# Patient Record
Sex: Male | Born: 1980 | Race: White | Hispanic: No | Marital: Married | Smoking: Former smoker
Health system: Southern US, Community
[De-identification: ages and names within clinical notes are randomized; demographics above are authoritative.]

## PROBLEM LIST (undated history)

## (undated) DIAGNOSIS — K219 Gastro-esophageal reflux disease without esophagitis: Secondary | ICD-10-CM

## (undated) DIAGNOSIS — N200 Calculus of kidney: Secondary | ICD-10-CM

---

## 1999-05-11 ENCOUNTER — Emergency Department (HOSPITAL_COMMUNITY): Admission: EM | Admit: 1999-05-11 | Discharge: 1999-05-11 | Payer: Self-pay | Admitting: Emergency Medicine

## 2005-09-11 ENCOUNTER — Emergency Department (HOSPITAL_COMMUNITY): Admission: EM | Admit: 2005-09-11 | Discharge: 2005-09-11 | Payer: Self-pay | Admitting: Emergency Medicine

## 2006-01-19 ENCOUNTER — Emergency Department (HOSPITAL_COMMUNITY): Admission: EM | Admit: 2006-01-19 | Discharge: 2006-01-19 | Payer: Self-pay | Admitting: Emergency Medicine

## 2006-01-20 ENCOUNTER — Emergency Department (HOSPITAL_COMMUNITY): Admission: EM | Admit: 2006-01-20 | Discharge: 2006-01-20 | Payer: Self-pay | Admitting: *Deleted

## 2006-01-25 ENCOUNTER — Emergency Department (HOSPITAL_COMMUNITY): Admission: EM | Admit: 2006-01-25 | Discharge: 2006-01-25 | Payer: Self-pay | Admitting: Emergency Medicine

## 2006-07-08 ENCOUNTER — Emergency Department (HOSPITAL_COMMUNITY): Admission: EM | Admit: 2006-07-08 | Discharge: 2006-07-08 | Payer: Self-pay | Admitting: Emergency Medicine

## 2007-03-31 ENCOUNTER — Emergency Department (HOSPITAL_COMMUNITY): Admission: EM | Admit: 2007-03-31 | Discharge: 2007-03-31 | Payer: Self-pay | Admitting: Emergency Medicine

## 2011-06-02 LAB — URINALYSIS, ROUTINE W REFLEX MICROSCOPIC
Glucose, UA: NEGATIVE
Leukocytes, UA: NEGATIVE
Nitrite: NEGATIVE

## 2011-06-02 LAB — URINE MICROSCOPIC-ADD ON

## 2013-04-06 ENCOUNTER — Emergency Department (HOSPITAL_COMMUNITY): Payer: Self-pay

## 2013-04-06 ENCOUNTER — Emergency Department (HOSPITAL_COMMUNITY)
Admission: EM | Admit: 2013-04-06 | Discharge: 2013-04-06 | Disposition: A | Discharge status: Home / Self Care | Payer: Self-pay | Attending: Emergency Medicine | Admitting: Emergency Medicine

## 2013-04-06 ENCOUNTER — Encounter (HOSPITAL_COMMUNITY): Payer: Self-pay | Admitting: Emergency Medicine

## 2013-04-06 DIAGNOSIS — R231 Pallor: Secondary | ICD-10-CM | POA: Insufficient documentation

## 2013-04-06 DIAGNOSIS — K5900 Constipation, unspecified: Secondary | ICD-10-CM | POA: Insufficient documentation

## 2013-04-06 DIAGNOSIS — Z87442 Personal history of urinary calculi: Secondary | ICD-10-CM | POA: Insufficient documentation

## 2013-04-06 DIAGNOSIS — F17200 Nicotine dependence, unspecified, uncomplicated: Secondary | ICD-10-CM | POA: Insufficient documentation

## 2013-04-06 HISTORY — DX: Calculus of kidney: N20.0

## 2013-04-06 MED ORDER — MAGNESIUM CITRATE PO SOLN
1.0000 | Freq: Once | ORAL | Status: AC
  Administered 2013-04-06: 1 via ORAL
  Filled 2013-04-06: qty 296

## 2013-04-06 MED ORDER — MINERAL OIL RE ENEM
1.0000 | ENEMA | Freq: Once | RECTAL | Status: AC
  Administered 2013-04-06: 1 via RECTAL
  Filled 2013-04-06: qty 1

## 2013-04-06 NOTE — ED Provider Notes (Signed)
CSN: [id number]     Arrival date & time 04/06/13  7829 History     First MD Initiated Contact with Patient 04/06/13 0248     Chief Complaint  Patient presents with  . Abdominal Pain   (Consider location/radiation/quality/duration/timing/severity/associated sxs/prior Treatment) HPI Comments: Vision states, that he has a small bowel movement.  On Monday, but still feels, like he has to have an additional bowel movement.  He has tried taking the Floxin is at or a Gas-X and Pepto-Bismol without relief.  He denies any previous history of constipation.  He has had no abdominal surgeries.  She does not take medication on a regular basis.  He does not have any ill contacts  Patient is a 32 y.o. male presenting with abdominal pain. The history is provided by the patient.  Abdominal Pain Pain location:  Generalized Pain quality: pressure   Pain radiates to:  Does not radiate Pain severity:  Moderate Onset quality:  Gradual Duration:  2 days Timing:  Constant Progression:  Worsening Chronicity:  New Context: not alcohol use, not awakening from sleep, not diet changes, not eating, not laxative use, not medication withdrawal, not previous surgeries and not recent illness   Relieved by:  Nothing Exacerbated by: Patient has take and duplex suppository, Gas-X and Pepto-Bismol with out having a bowel movement. Ineffective treatments: Block suppository, Gas-X and Pepto-Bismol. Associated symptoms: constipation   Associated symptoms: no chills, no diarrhea, no dysuria, no fever, no nausea, no shortness of breath and no vomiting     Past Medical History  Diagnosis Date  . Kidney stones    History reviewed. No pertinent past surgical history. Family History  Problem Relation Age of Onset  . Cancer Other   . Hypertension Other   . Hyperlipidemia Other   . COPD Other   . CAD Other    History  Substance Use Topics  . Smoking status: Current Some Day Smoker  . Smokeless tobacco: Not on file   . Alcohol Use: No    Review of Systems  Constitutional: Negative for fever and chills.  Respiratory: Negative for shortness of breath.   Gastrointestinal: Positive for abdominal pain and constipation. Negative for nausea, vomiting, diarrhea and blood in stool.  Genitourinary: Negative for dysuria and flank pain.  Musculoskeletal: Negative for joint swelling.  Neurological: Negative for dizziness.  All other systems reviewed and are negative.    Allergies  Review of patient's allergies indicates no known allergies.  Home Medications  No current outpatient prescriptions on file. BP 120/64  Pulse 80  Temp(Src) 98.2 F (36.8 C) (Oral)  Resp 22  Ht 6' (1.829 m)  Wt 160 lb 8 oz (72.802 kg)  BMI 21.76 kg/m2  SpO2 100% Physical Exam  Nursing note and vitals reviewed. Constitutional: He is oriented to person, place, and time. He appears well-developed and well-nourished.  HENT:  Head: Normocephalic.  Eyes: Pupils are equal, round, and reactive to light.  Cardiovascular: Normal rate and regular rhythm.   Pulmonary/Chest: Effort normal.  Abdominal: Bowel sounds are normal. He exhibits no distension. There is generalized tenderness.  Musculoskeletal: Normal range of motion.  Neurological: He is alert and oriented to person, place, and time.  Skin: Skin is warm and dry. There is pallor.    ED Course   Procedures (including critical care time)  Labs Reviewed - No data to display No results found. No diagnosis found.  MDM  Patient had no results from fleets enema.  Digital exam reveals that there  is no stool in the rectal vault I have requested a soapsuds enema.  Patient states he feels better lying on his side in the fetal position Patient is starting to get results from the soap suds enema.  Will add magnesium Citrate  Arman Filter, NP 04/06/13 [phone]

## 2013-04-06 NOTE — ED Notes (Signed)
Pt states since Sat he has not had a good BM  Monday around 6pm he started having abd pain that has progressively gotten worse since then   Pt states the pain got much worse after eating supper  Pt states he has taken dulcolax, gas x and pepto without relief

## 2013-04-07 NOTE — ED Provider Notes (Signed)
Medical screening examination/treatment/procedure(s) were performed by non-physician practitioner and as supervising physician I was immediately available for consultation/collaboration.  Gilda Crease, MD 04/07/13 [phone]

## 2014-08-15 ENCOUNTER — Emergency Department (HOSPITAL_COMMUNITY): Payer: 59

## 2014-08-15 ENCOUNTER — Encounter (HOSPITAL_COMMUNITY): Payer: Self-pay | Admitting: Emergency Medicine

## 2014-08-15 ENCOUNTER — Emergency Department (HOSPITAL_COMMUNITY)
Admission: EM | Admit: 2014-08-15 | Discharge: 2014-08-16 | Disposition: A | Discharge status: Home / Self Care | Payer: 59 | Attending: Emergency Medicine | Admitting: Emergency Medicine

## 2014-08-15 DIAGNOSIS — R1030 Lower abdominal pain, unspecified: Secondary | ICD-10-CM | POA: Insufficient documentation

## 2014-08-15 DIAGNOSIS — Z87442 Personal history of urinary calculi: Secondary | ICD-10-CM | POA: Insufficient documentation

## 2014-08-15 DIAGNOSIS — R109 Unspecified abdominal pain: Secondary | ICD-10-CM | POA: Diagnosis present

## 2014-08-15 DIAGNOSIS — Z720 Tobacco use: Secondary | ICD-10-CM | POA: Insufficient documentation

## 2014-08-15 LAB — URINE MICROSCOPIC-ADD ON

## 2014-08-15 LAB — URINALYSIS, ROUTINE W REFLEX MICROSCOPIC
Bilirubin Urine: NEGATIVE
GLUCOSE, UA: NEGATIVE mg/dL
Ketones, ur: NEGATIVE mg/dL
Leukocytes, UA: NEGATIVE
NITRITE: NEGATIVE
PH: 6.5 (ref 5.0–8.0)
Protein, ur: NEGATIVE mg/dL
SPECIFIC GRAVITY, URINE: 1.007 (ref 1.005–1.030)
Urobilinogen, UA: 0.2 mg/dL (ref 0.0–1.0)

## 2014-08-15 LAB — CBC
HCT: 39.4 % (ref 39.0–52.0)
Hemoglobin: 12.9 g/dL — ABNORMAL LOW (ref 13.0–17.0)
MCH: 29.7 pg (ref 26.0–34.0)
MCHC: 32.7 g/dL (ref 30.0–36.0)
MCV: 90.8 fL (ref 78.0–100.0)
Platelets: 172 10*3/uL (ref 150–400)
RBC: 4.34 MIL/uL (ref 4.22–5.81)
RDW: 12.7 % (ref 11.5–15.5)
WBC: 7.6 10*3/uL (ref 4.0–10.5)

## 2014-08-15 LAB — COMPREHENSIVE METABOLIC PANEL
ALT: 12 U/L (ref 0–53)
ANION GAP: 4 — AB (ref 5–15)
AST: 15 U/L (ref 0–37)
Albumin: 4.7 g/dL (ref 3.5–5.2)
Alkaline Phosphatase: 48 U/L (ref 39–117)
BUN: 12 mg/dL (ref 6–23)
CALCIUM: 9.2 mg/dL (ref 8.4–10.5)
CHLORIDE: 106 meq/L (ref 96–112)
CO2: 28 mmol/L (ref 19–32)
Creatinine, Ser: 0.86 mg/dL (ref 0.50–1.35)
GFR calc Af Amer: >90 mL/min (ref >90)
GFR calc non Af Amer: >90 mL/min (ref >90)
GLUCOSE: 89 mg/dL (ref 70–99)
Potassium: 3.9 mmol/L (ref 3.5–5.1)
Sodium: 138 mmol/L (ref 135–145)
Total Bilirubin: 0.7 mg/dL (ref 0.3–1.2)
Total Protein: 6.9 g/dL (ref 6.0–8.3)

## 2014-08-15 MED ORDER — HYDROCODONE-ACETAMINOPHEN 5-325 MG PO TABS
2.0000 | ORAL_TABLET | Freq: Once | ORAL | Status: AC
Start: 2014-08-15 — End: 2014-08-15
  Administered 2014-08-15: 2 via ORAL
  Filled 2014-08-15: qty 2

## 2014-08-15 MED ORDER — OXYCODONE-ACETAMINOPHEN 10-325 MG PO TABS: 1.0000 | ORAL_TABLET | Freq: Four times a day (QID) | ORAL | Status: DC | PRN

## 2014-08-15 MED ORDER — TAMSULOSIN HCL 0.4 MG PO CAPS: 0.4000 mg | ORAL_CAPSULE | Freq: Every day | ORAL | Status: DC

## 2014-08-15 MED ORDER — ONDANSETRON HCL 4 MG PO TABS: 4.0000 mg | ORAL_TABLET | Freq: Four times a day (QID) | ORAL | Status: DC

## 2014-08-15 NOTE — Discharge Instructions (Signed)
Flank Pain Flank pain refers to pain that is located on the side of the body between the upper abdomen and the back. The pain may occur over a short period of time (acute) or may be long-term or reoccurring (chronic). It may be mild or severe. Flank pain can be caused by many things. CAUSES  Some of the more common causes of flank pain include:  Muscle strains.   Muscle spasms.   A disease of your spine (vertebral disk disease).   A lung infection (pneumonia).   Fluid around your lungs (pulmonary edema).   A kidney infection.   Kidney stones.   A very painful skin rash caused by the chickenpox virus (shingles).   Gallbladder disease.  HOME CARE INSTRUCTIONS  Home care will depend on the cause of your pain. In general,  Rest as directed by your caregiver.  Drink enough fluids to keep your urine clear or pale yellow.  Only take over-the-counter or prescription medicines as directed by your caregiver. Some medicines may help relieve the pain.  Tell your caregiver about any changes in your pain.  Follow up with your caregiver as directed. SEEK IMMEDIATE MEDICAL CARE IF:   Your pain is not controlled with medicine.   You have new or worsening symptoms.  Your pain increases.   You have abdominal pain.   You have shortness of breath.   You have persistent nausea or vomiting.   You have swelling in your abdomen.   You feel faint or pass out.   You have blood in your urine.  You have a fever or persistent symptoms for more than 2-3 days.  You have a fever and your symptoms suddenly get worse. MAKE SURE YOU:   Understand these instructions.  Will watch your condition.  Will get help right away if you are not doing well or get worse. Document Released: 09/25/2005 Document Revised: 04/28/2012 Document Reviewed: 03/18/2012 [facility] Patient Information 2015 [employer] [place]. This information is not intended to replace advice given to you by your  health care provider. Make sure you discuss any questions you have with your health care provider.   It is important to follow-up with Dr. Marlou PorchHerrick and urology for further evaluation and management of your flank pain. Take your nausea medication as needed as well as your pain medicine for severe pain. Return to ED for worsening symptoms.

## 2014-08-15 NOTE — ED Provider Notes (Signed)
CSN: [id number]     Arrival date & time 08/15/14  1627 History   First MD Initiated Contact with Patient 08/15/14 1804     Chief Complaint  Patient presents with  . Abdominal Pain  . 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 male with a history of kidney stones comes in for evaluation of left groin and abdominal pain. Patient states a week ago Monday he began to experience left groin discomfort that was similar to pain he experienced with kidney stones in the past. He reports the pain as intermittent and sharp and crampy. He also reports 2 episodes of hematuria, hesitancy and dysuria last week. He denies any discomfort now in the ED, but is concerned the pain will return. He reports he does have his own urologist but has not seen him in some time. He has taken Advil, Flomax and Percocet with relief of his symptoms. Last bowel movement this morning and was normal for him. Denies fevers, nausea or vomiting, diarrhea or constipation, testicular or scrotal pain redness or swelling. No other abdominal surgeries  Past Medical History  Diagnosis Date  . Kidney stones    History reviewed. No pertinent past surgical history. Family History  Problem Relation Age of Onset  . Cancer Other   . Hypertension Other   . Hyperlipidemia Other   . COPD Other   . CAD Other    History  Substance Use Topics  . Smoking status: Current Some Day Smoker  . Smokeless tobacco: Not on file  . Alcohol Use: No    Review of Systems  A 10 point review of systems was completed and was negative except for pertinent positives and negatives as mentioned in the history of present illness    Allergies  Review of patient's allergies indicates no known allergies.  Home Medications   Prior to Admission medications   Medication Sig Start Date End Date Taking? Authorizing Provider  ibuprofen (ADVIL,MOTRIN) 200 MG  tablet Take 400 mg by mouth every 6 (six) hours as needed for moderate pain (pain).   Yes Historical Provider, MD  ondansetron (ZOFRAN) 4 MG tablet Take 1 tablet (4 mg total) by mouth every 6 (six) hours. 08/15/14   Sharlene MottsBenjamin W Shir Bergman, PA-C  oxyCODONE-acetaminophen (PERCOCET) 10-325 MG per tablet Take 1 tablet by mouth every 6 (six) hours as needed for pain (pain). 08/15/14   Sharlene MottsBenjamin W Joeseph Verville, PA-C  tamsulosin (FLOMAX) 0.4 MG CAPS capsule Take 1 capsule (0.4 mg total) by mouth daily. 08/15/14   Earle GellBenjamin W Seydina Holliman, PA-C   BP 123/67 mmHg  Pulse 55  Temp(Src) 98.5 F (36.9 C) (Oral)  Resp 18  SpO2 100% Physical Exam  Constitutional: He is oriented to person, place, and time. He appears well-developed and well-nourished.  HENT:  Head: Normocephalic and atraumatic.  Mouth/Throat: Oropharynx is clear and moist.  Eyes: Conjunctivae are normal. Pupils are equal, round, and reactive to light. Right eye exhibits no discharge. Left eye exhibits no discharge. No scleral icterus.  Neck: Neck supple.  Cardiovascular: Normal rate, regular rhythm and normal heart sounds.   Pulmonary/Chest: Effort normal and breath sounds normal. No respiratory distress. He has no wheezes. He has no rales.  Abdominal: Soft. There is no tenderness.  Mild tenderness over suprapubic region. Abdomen soft, nondistended. No guarding or rebound. No lesions, deformities or other masses appreciated. No CVA tenderness. No peritoneal signs or other evidence of acute abdomen  Musculoskeletal: He exhibits  no tenderness.  Neurological: He is alert and oriented to person, place, and time.  Cranial Nerves II-XII grossly intact  Skin: Skin is warm and dry. No rash noted.  Psychiatric: He has a normal mood and affect.  Nursing note and vitals reviewed.   ED Course  Procedures (including critical care time) Labs Review Labs Reviewed  CBC - Abnormal; Notable for the following:    Hemoglobin 12.9 (*)    All other components within  normal limits  COMPREHENSIVE METABOLIC PANEL - Abnormal; Notable for the following:    Anion gap 4 (*)    All other components within normal limits  URINALYSIS, ROUTINE W REFLEX MICROSCOPIC - Abnormal; Notable for the following:    Hgb urine dipstick LARGE (*)    All other components within normal limits  URINE MICROSCOPIC-ADD ON    Imaging Review [facility]  08/15/2014   CLINICAL DATA:  33 year old male with left-sided flank pain. History of kidney stones.  EXAM: RENAL/URINARY TRACT ULTRASOUND COMPLETE  COMPARISON:  Abdominal ultrasound 03/31/2007.  FINDINGS: Right Kidney:  Length: 10.8. Echogenicity within normal limits. No mass or hydronephrosis visualized.  Left Kidney:  Length: 11.8. Echogenicity within normal limits. No mass or hydronephrosis visualized.  Bladder:  Appears normal for degree of bladder distention. Bilateral ureteral jets are noted.  IMPRESSION: 1. Normal urinary tract ultrasound, as above. Specifically, no definite stones and no hydronephrosis noted.   Electronically Signed   By: Trudie Reedaniel  Entrikin M.D.   On: 08/15/2014 22:42     EKG Interpretation None     Meds given in ED:  Medications  HYDROcodone-acetaminophen (NORCO/VICODIN) 5-325 MG per tablet 2 tablet (2 tablets Oral Given 08/15/14 2203)    Discharge Medication List as of 08/15/2014 11:33 PM     Filed Vitals:   08/15/14 1631 08/15/14 1848 08/15/14 2352  BP: 143/85 134/74 123/67  Pulse: 80 60 55  Temp: 98.5 F (36.9 C)    TempSrc: Oral    Resp: 20 14 18   SpO2: 100% 100% 100%    MDM  Colin Young is a 33 y.o. male with a history of kidney stones presents today for evaluation of left flank and groin pain  Vitals stable  -afebrile Pt resting comfortably in ED. reports pain medicine improved pain. PE--not concerning further acute or emergent pathology. Benign abdominal exam, no evidence of acute abdomen Labwork--hemoglobin evident on urinalysis, no evidence of UTI. Imaging--renal ultrasound shows  normal urinary tract with no definite stones and no hydronephrosis. DDX--patient likely has small, nonobstructing stone without UTI. We'll treat empirically and have patient follow-up with urology. Will DC with anti-emetics, pain medicine, tamsulosin Discussed f/u with PCP and return precautions, pt very amenable to plan.  Final diagnoses:  Flank pain        Sharlene MottsBenjamin W Andilynn Delavega, PA-C 08/16/14 1105  Elwin MochaBlair Walden, MD 08/16/14 [phone]

## 2014-08-15 NOTE — ED Notes (Signed)
PT c/o abd pain, flank and groin pain, states she has a hx of kidney stones. Pt state at times urine stream id difficulty.

## 2014-08-16 NOTE — Progress Notes (Signed)
  CARE MANAGEMENT ED NOTE 08/16/2014  Patient:  Murlean HarkCAMERON,Mishael M   Account Number:  [id number]  Date Initiated:  08/16/2014  Documentation initiated by:  Radford PaxFERRERO,Jaquay Morneault  Subjective/Objective Assessment:   Patient presents to Ed with abdominal pain, flank and groin pain     Subjective/Objective Assessment Detail:   Patient with history of kidney stones     Action/Plan:   Action/Plan Detail:   Anticipated DC Date:  08/16/2014     Status Recommendation to Physician:   Result of Recommendation:    Other ED Services  Consult Working Plan    DC Planning Services  Other  PCP issues    Choice offered to / List presented to:            Status of service:  Completed, signed off  ED Comments:   ED Comments Detail:  EDCM spoke to patient at bedside.  Patient confirms his pcp is DR. Donovan KailAllen Ross.  System updated.

## 2015-07-07 ENCOUNTER — Encounter (HOSPITAL_COMMUNITY): Payer: Self-pay | Admitting: Emergency Medicine

## 2015-07-07 ENCOUNTER — Emergency Department (INDEPENDENT_AMBULATORY_CARE_PROVIDER_SITE_OTHER)
Admission: EM | Admit: 2015-07-07 | Discharge: 2015-07-07 | Disposition: A | Discharge status: Home / Self Care | Payer: 59 | Source: Home / Self Care | Attending: Emergency Medicine | Admitting: Emergency Medicine

## 2015-07-07 DIAGNOSIS — N200 Calculus of kidney: Secondary | ICD-10-CM

## 2015-07-07 LAB — POCT URINALYSIS DIP (DEVICE)
BILIRUBIN URINE: NEGATIVE
Glucose, UA: NEGATIVE mg/dL
NITRITE: NEGATIVE
PH: 6 (ref 5.0–8.0)
Protein, ur: NEGATIVE mg/dL
Specific Gravity, Urine: 1.025 (ref 1.005–1.030)
Urobilinogen, UA: 0.2 mg/dL (ref 0.0–1.0)

## 2015-07-07 MED ORDER — IBUPROFEN 600 MG PO TABS: 600.0000 mg | ORAL_TABLET | Freq: Four times a day (QID) | ORAL | Status: AC | PRN

## 2015-07-07 MED ORDER — TAMSULOSIN HCL 0.4 MG PO CAPS: 0.4000 mg | ORAL_CAPSULE | Freq: Every day | ORAL | Status: AC

## 2015-07-07 MED ORDER — OXYCODONE-ACETAMINOPHEN 10-325 MG PO TABS: 1.0000 | ORAL_TABLET | Freq: Four times a day (QID) | ORAL | Status: AC | PRN

## 2015-07-07 MED ORDER — CEPHALEXIN 500 MG PO CAPS: 500.0000 mg | ORAL_CAPSULE | Freq: Four times a day (QID) | ORAL | Status: AC

## 2015-07-07 NOTE — ED Provider Notes (Signed)
CSN: [id number]     Arrival date & time 07/07/15  1526 History   First MD Initiated Contact with Patient 07/07/15 1538     Chief Complaint  Patient presents with  . Flank Pain   (Consider location/radiation/quality/duration/timing/severity/associated sxs/prior Treatment) HPI  He is a 34 year old man here for evaluation of right groin pain. He has a history of recurrent kidney stones and thinks that is what this is. He states 2 days ago he had some pain in the right flank. He works as a Nutritional therapist and attributed at that time to musculoskeletal pain. 2 days ago, he developed dull pain in the right groin with intermittent sharp shooting pains that go to the urethra. He also reports some discomfort at the end of urination. He denies any nausea or vomiting. No fevers or chills. He has seen a urologist in the past for his kidney stones and was diagnosed with calcium type stones. He was told to drink lots of water. He states he intermittently does well with drinking water.  Past Medical History  Diagnosis Date  . Kidney stones    History reviewed. No pertinent past surgical history. Family History  Problem Relation Age of Onset  . Cancer Other   . Hypertension Other   . Hyperlipidemia Other   . COPD Other   . CAD Other    Social History  Substance Use Topics  . Smoking status: Current Some Day Smoker  . Smokeless tobacco: None  . Alcohol Use: No    Review of Systems As in history of present illness Allergies  Review of patient's allergies indicates no known allergies.  Home Medications   Prior to Admission medications   Medication Sig Start Date End Date Taking? Authorizing Provider  cephALEXin (KEFLEX) 500 MG capsule Take 1 capsule (500 mg total) by mouth 4 (four) times daily. 07/07/15   [name], MD  ibuprofen (ADVIL,MOTRIN) 600 MG tablet Take 1 tablet (600 mg total) by mouth every 6 (six) hours as needed for moderate pain. 07/07/15   [name], MD  ondansetron (ZOFRAN) 4 MG  tablet Take 1 tablet (4 mg total) by mouth every 6 (six) hours. 08/15/14   Joycie Peek, PA-C  oxyCODONE-acetaminophen (PERCOCET) 10-325 MG tablet Take 1 tablet by mouth every 6 (six) hours as needed for pain (pain). 07/07/15   [name], MD  tamsulosin (FLOMAX) 0.4 MG CAPS capsule Take 1 capsule (0.4 mg total) by mouth daily. 07/07/15   [name], MD   Meds Ordered and Administered this Visit  Medications - No data to display  BP 123/76 mmHg  Pulse 88  Temp(Src) 98.2 F (36.8 C) (Oral)  Resp 18  SpO2 100% No data found.   Physical Exam  Constitutional: He is oriented to person, place, and time. He appears well-developed and well-nourished. No distress.  Neck: Neck supple.  Cardiovascular: Normal rate.   Pulmonary/Chest: Effort normal.  Abdominal: Soft. Bowel sounds are normal. He exhibits no distension and no mass. There is tenderness (in suprapubic and right lower quadrant). There is no rebound and no guarding.  No CVA tenderness.  Neurological: He is alert and oriented to person, place, and time.    ED Course  Procedures (including critical care time)  Labs Review Labs Reviewed  POCT URINALYSIS DIP (DEVICE) - Abnormal; Notable for the following:    Ketones, ur TRACE (*)    Hgb urine dipstick MODERATE (*)    Leukocytes, UA TRACE (*)    All other  components within normal limits  URINE CULTURE    Imaging Review No results found.    MDM   1. Kidney stone    UA is consistent with kidney stone. With leukocytes is also concerning for developing infection. Urine has been sent for culture. We'll treat with Flomax, ibuprofen, and Percocet. Keflex for one week for possible infection. Follow-up as needed.    Charm RingsErin J Honig, MD 07/07/15 [phone]

## 2015-07-07 NOTE — Discharge Instructions (Signed)
It sounds like you have another kidney stone. Take Flomax daily for 2 weeks or until the stone passes. Use the ibuprofen and Percocet as needed for pain. Take the Keflex one pill 4 times a day for the next week. I'm concerned that the stone is infected. Follow-up with your urologist as needed.

## 2015-07-07 NOTE — ED Notes (Signed)
[facility] a [facility]. The patient does have an extensive history of renal calculi.

## 2016-01-09 ENCOUNTER — Encounter (HOSPITAL_COMMUNITY): Payer: Self-pay | Admitting: Emergency Medicine

## 2016-01-09 ENCOUNTER — Emergency Department (HOSPITAL_COMMUNITY)
Admission: EM | Admit: 2016-01-09 | Discharge: 2016-01-09 | Disposition: A | Discharge status: Home / Self Care | Payer: Self-pay | Attending: Emergency Medicine | Admitting: Emergency Medicine

## 2016-01-09 ENCOUNTER — Emergency Department (HOSPITAL_COMMUNITY): Payer: Self-pay

## 2016-01-09 DIAGNOSIS — Z791 Long term (current) use of non-steroidal anti-inflammatories (NSAID): Secondary | ICD-10-CM | POA: Insufficient documentation

## 2016-01-09 DIAGNOSIS — R112 Nausea with vomiting, unspecified: Secondary | ICD-10-CM | POA: Insufficient documentation

## 2016-01-09 DIAGNOSIS — Z792 Long term (current) use of antibiotics: Secondary | ICD-10-CM | POA: Insufficient documentation

## 2016-01-09 DIAGNOSIS — R1033 Periumbilical pain: Secondary | ICD-10-CM | POA: Insufficient documentation

## 2016-01-09 DIAGNOSIS — R1013 Epigastric pain: Secondary | ICD-10-CM | POA: Insufficient documentation

## 2016-01-09 LAB — CBC
HCT: 43 % (ref 39.0–52.0)
HEMOGLOBIN: 14.7 g/dL (ref 13.0–17.0)
MCH: 30.5 pg (ref 26.0–34.0)
MCHC: 34.2 g/dL (ref 30.0–36.0)
MCV: 89.2 fL (ref 78.0–100.0)
Platelets: 191 10*3/uL (ref 150–400)
RBC: 4.82 MIL/uL (ref 4.22–5.81)
RDW: 12.6 % (ref 11.5–15.5)
WBC: 10.1 10*3/uL (ref 4.0–10.5)

## 2016-01-09 LAB — URINE MICROSCOPIC-ADD ON
BACTERIA UA: NONE SEEN
SQUAMOUS EPITHELIAL / LPF: NONE SEEN

## 2016-01-09 LAB — URINALYSIS, ROUTINE W REFLEX MICROSCOPIC
Bilirubin Urine: NEGATIVE
Glucose, UA: NEGATIVE mg/dL
Ketones, ur: 15 mg/dL — AB
Leukocytes, UA: NEGATIVE
NITRITE: NEGATIVE
Protein, ur: NEGATIVE mg/dL
pH: 7 (ref 5.0–8.0)

## 2016-01-09 LAB — LIPASE, BLOOD: LIPASE: 58 U/L — AB (ref 11–51)

## 2016-01-09 LAB — COMPREHENSIVE METABOLIC PANEL
ALK PHOS: 53 U/L (ref 38–126)
ALT: 14 U/L — ABNORMAL LOW (ref 17–63)
ANION GAP: 10 (ref 5–15)
AST: 19 U/L (ref 15–41)
Albumin: 4.6 g/dL (ref 3.5–5.0)
BUN: 13 mg/dL (ref 6–20)
CALCIUM: 9.3 mg/dL (ref 8.9–10.3)
CO2: 24 mmol/L (ref 22–32)
Chloride: 104 mmol/L (ref 101–111)
Creatinine, Ser: 0.92 mg/dL (ref 0.61–1.24)
GFR calc non Af Amer: >60 mL/min (ref >60)
Glucose, Bld: 115 mg/dL — ABNORMAL HIGH (ref 65–99)
POTASSIUM: 3.8 mmol/L (ref 3.5–5.1)
SODIUM: 138 mmol/L (ref 135–145)
Total Bilirubin: 0.6 mg/dL (ref 0.3–1.2)
Total Protein: 7 g/dL (ref 6.5–8.1)

## 2016-01-09 MED ORDER — PANTOPRAZOLE SODIUM 40 MG IV SOLR
40.0000 mg | Freq: Once | INTRAVENOUS | Status: AC
  Administered 2016-01-09: 40 mg via INTRAVENOUS
  Filled 2016-01-09: qty 40

## 2016-01-09 MED ORDER — HYDROMORPHONE HCL 1 MG/ML IJ SOLN
1.0000 mg | Freq: Once | INTRAMUSCULAR | Status: AC
  Administered 2016-01-09: 1 mg via INTRAVENOUS
  Filled 2016-01-09: qty 1

## 2016-01-09 MED ORDER — ONDANSETRON HCL 4 MG/2ML IJ SOLN
4.0000 mg | Freq: Once | INTRAMUSCULAR | Status: AC
  Administered 2016-01-09: 4 mg via INTRAVENOUS
  Filled 2016-01-09: qty 2

## 2016-01-09 MED ORDER — IOPAMIDOL (ISOVUE-300) INJECTION 61%
100.0000 mL | Freq: Once | INTRAVENOUS | Status: AC | PRN
  Administered 2016-01-09: 100 mL via INTRAVENOUS

## 2016-01-09 MED ORDER — ONDANSETRON 4 MG PO TBDP
4.0000 mg | ORAL_TABLET | Freq: Once | ORAL | Status: AC | PRN
  Administered 2016-01-09: 4 mg via ORAL
  Filled 2016-01-09: qty 1

## 2016-01-09 MED ORDER — SODIUM CHLORIDE 0.9 % IV BOLUS (SEPSIS)
1000.0000 mL | Freq: Once | INTRAVENOUS | Status: AC
  Administered 2016-01-09: 1000 mL via INTRAVENOUS

## 2016-01-09 MED ORDER — OMEPRAZOLE 20 MG PO CPDR: 20.0000 mg | DELAYED_RELEASE_CAPSULE | Freq: Every day | ORAL | Status: AC

## 2016-01-09 MED ORDER — GI COCKTAIL ~~LOC~~
30.0000 mL | Freq: Once | ORAL | Status: AC
  Administered 2016-01-09: 30 mL via ORAL
  Filled 2016-01-09: qty 30

## 2016-01-09 MED ORDER — MORPHINE SULFATE (PF) 4 MG/ML IV SOLN
4.0000 mg | Freq: Once | INTRAVENOUS | Status: AC
  Administered 2016-01-09: 4 mg via INTRAVENOUS
  Filled 2016-01-09: qty 1

## 2016-01-09 NOTE — ED Notes (Addendum)
Pt made aware of need for urine for the second time. States that he is unable at present.

## 2016-01-09 NOTE — Discharge Instructions (Signed)

## 2016-01-09 NOTE — ED Notes (Signed)
Pt states he started having some mild abd pain on Monday that has progressively gotten worse  Pt states the pain is in the upper quadrants and it makes it hard to breathe  Pt has nausea without vomiting  Last BM was on Tuesday morning

## 2016-01-09 NOTE — ED Provider Notes (Signed)
CSN: [id number]     Arrival date & time 01/09/16  0230 History  By signing my name below, I, [facility], attest that this documentation has been prepared under the direction and in the presence of Colin Baton, MD. Electronically Signed: Randell Young, ED Scribe. 01/09/2016. 3:54 AM.   Chief Complaint  Young presents with  . Abdominal Pain    The history is provided by the Young. No language interpreter was used.   HPI Comments: Colin Young is a 35 y.o. male with an hx of kidney stones who presents to the Emergency Department complaining of constant, 7/10, gradually worsening upper abdominal pain onset 2 days ago. He reports nausea and vomiting 1x yesterday. No association with food. He has had loose BMs, most recently yesterday. His states that his pain has no alleviating or exacerbating factors. He has taken Gas-X and Miralax at home without relief and Zofran in the ED with relief of his nausea and emesis only. He notes similar symptoms 2 years ago for which he was evaluated in the [facility] ED by Earley Favor, NP, diagnosed with constipation, and discharged home. Denies hx of ETOH abuse, cholecystectomy, appendectomy, bowel obstructions. Denies fevers, constipation, urinary symptoms, or any other symptoms currently.  Past Medical History  Diagnosis Date  . Kidney stones    History reviewed. No pertinent past surgical history. Family History  Problem Relation Age of Onset  . Cancer Other   . Hypertension Other   . Hyperlipidemia Other   . COPD Other   . CAD Other    Social History  Substance Use Topics  . Smoking status: Never Smoker   . Smokeless tobacco: None  . Alcohol Use: No    Review of Systems  Constitutional: Negative for fever.  Respiratory: Negative for shortness of breath.   Cardiovascular: Negative for chest pain.  Gastrointestinal: Positive for nausea, vomiting and abdominal pain. Negative for constipation.  Genitourinary: Negative.    All other systems reviewed and are negative.   Allergies  Review of Young's allergies indicates no known allergies.  Home Medications   Prior to Admission medications   Medication Sig Start Date End Date Taking? Authorizing Provider  polyethylene glycol (MIRALAX / GLYCOLAX) packet Take 17 g by mouth daily as needed for mild constipation or moderate constipation.   Yes Historical Provider, MD  simethicone (MYLICON) 125 MG chewable tablet Chew 125 mg by mouth every 6 (six) hours as needed for flatulence.   Yes Historical Provider, MD  cephALEXin (KEFLEX) 500 MG capsule Take 1 capsule (500 mg total) by mouth 4 (four) times daily. Young not taking: Reported on 01/09/2016 07/07/15   [name], MD  ibuprofen (ADVIL,MOTRIN) 600 MG tablet Take 1 tablet (600 mg total) by mouth every 6 (six) hours as needed for moderate pain. Young not taking: Reported on 01/09/2016 07/07/15   [name], MD  omeprazole (PRILOSEC) 20 MG capsule Take 1 capsule (20 mg total) by mouth daily. 01/09/16   Colin Baton, MD  oxyCODONE-acetaminophen (PERCOCET) 10-325 MG tablet Take 1 tablet by mouth every 6 (six) hours as needed for pain (pain). Young not taking: Reported on 01/09/2016 07/07/15   [name], MD  tamsulosin (FLOMAX) 0.4 MG CAPS capsule Take 1 capsule (0.4 mg total) by mouth daily. Young not taking: Reported on 01/09/2016 07/07/15   [name], MD   BP 127/85 mmHg  Pulse 61  Temp(Src) 97.7 F (36.5 C) (Oral)  Resp 18  Ht 6' (  1.829 m)  Wt 155 lb (70.308 kg)  BMI 21.02 kg/m2  SpO2 100% Physical Exam  Constitutional: He is oriented to person, place, and time. He appears well-developed and well-nourished. No distress.  HENT:  Head: Normocephalic and atraumatic.  Cardiovascular: Normal rate, regular rhythm and normal heart sounds.   No murmur heard. Pulmonary/Chest: Effort normal and breath sounds normal. No respiratory distress. He has no wheezes.  Abdominal: Soft. Bowel sounds are  normal. There is tenderness. There is no rebound.  Mild epigastric tenderness to palpation without rebound or guarding  Musculoskeletal: He exhibits no edema.  Neurological: He is alert and oriented to person, place, and time.  Skin: Skin is warm and dry.  Psychiatric: He has a normal mood and affect.  Nursing note and vitals reviewed.   ED Course  Procedures (including critical care time)  DIAGNOSTIC STUDIES: Oxygen Saturation is 100% on RA, normal by my interpretation.    COORDINATION OF CARE: 3:11 AM Discussed treatment plan with pt at bedside and pt agreed to plan.   Labs Review Labs Reviewed  LIPASE, BLOOD - Abnormal; Notable for the following:    Lipase 58 (*)    All other components within normal limits  COMPREHENSIVE METABOLIC PANEL - Abnormal; Notable for the following:    Glucose, Bld 115 (*)    ALT 14 (*)    All other components within normal limits  URINALYSIS, ROUTINE W REFLEX MICROSCOPIC (NOT AT [facility]) - Abnormal; Notable for the following:    Specific Gravity, Urine >1.046 (*)    Hgb urine dipstick MODERATE (*)    Ketones, ur 15 (*)    All other components within normal limits  CBC  URINE MICROSCOPIC-ADD ON    Imaging Review Dg Abd 1 View  01/09/2016  CLINICAL DATA:  Mid/ epigastric abdominal pain for 1 week.  Nausea. EXAM: ABDOMEN - 1 VIEW COMPARISON:  Radiographs 04/06/2013 FINDINGS: There is a normal bowel gas pattern. No dilated bowel loops to suggest obstruction. Small volume of stool throughout the colon. Questionable stone over the lower right kidney. Lung bases are clear. No osseous abnormality. IMPRESSION: 1. Normal bowel gas pattern. 2. Possible right nephrolithiasis. Electronically Signed   By: Colin Young  Ehinger M.D.   On: 01/09/2016 03:58   Ct Abdomen Pelvis W Contrast  01/09/2016  CLINICAL DATA:  Periumbilical abdominal pain for 2 days. EXAM: CT ABDOMEN AND PELVIS WITH CONTRAST TECHNIQUE: Multidetector CT imaging of the abdomen and pelvis was  performed using the standard protocol following bolus administration of intravenous contrast. CONTRAST:  100mL ISOVUE-300 IOPAMIDOL (ISOVUE-300) INJECTION 61% COMPARISON:  Radiographs earlier this day. FINDINGS: Lower chest:  The included lung bases are clear. Liver: Tiny hypodensities in the right lobe, too small to accurately characterize but of doubtful clinical significance. Hepatobiliary: Gallbladder physiologically distended, no calcified stone. No biliary dilatation. Pancreas: No ductal dilatation or inflammation. Spleen: Normal. Adrenal glands: No nodule. Kidneys: Symmetric renal enhancement. No hydronephrosis. Tiny nonobstructing renal stones bilaterally. No perinephric stranding. Stomach/Bowel: Stomach physiologically distended. Mild pre-pyloric gastric wall thickening, no adjacent inflammation. There are no dilated or thickened small bowel loops. Small volume of stool throughout the colon without colonic wall thickening. The appendix is normal. Vascular/Lymphatic: No retroperitoneal adenopathy. Abdominal aorta is normal in caliber. Reproductive: Normal sized prostate gland. Bladder: Physiologically distended, no wall thickening. Other: No free air, free fluid, or intra-abdominal fluid collection. Musculoskeletal: There are no acute or suspicious osseous abnormalities. IMPRESSION: 1. Mild pre-pyloric gastric wall thickening, can be seen with gastritis or  peptic ulcer disease. 2. Bilateral nonobstructing nephrolithiasis. Electronically Signed   By: Colin Oaks M.D.   On: 01/09/2016 05:02   I have personally reviewed and evaluated these images and lab results as part of my medical decision-making.   EKG Interpretation None      MDM   Final diagnoses:  Epigastric pain    Young presents with epigastric and periumbilical abdominal pain. Worsening of last 2 days. No association with food. He is nontoxic on exam.  Reports history of constipation which felt similar. However, he is having  normal stools. KUB shows no evidence of constipation or obstruction. Consider right-sided nephrolithiasis. Basic labwork obtained. Largely reassuring. Lipase 58. Young reports continued pain after pain and nausea medication. CT scan of the abdomen obtained to evaluate for atypical appendicitis presentation versus kidney stones. CT scan shows thickening of the gastric rim consistent with gastritis. While symptoms are not related to food, he Young was given Protonix and a GI cocktail. Will discharge home on omeprazole with GI follow-up. Young was able to tolerate fluids prior to discharge.  After history, exam, and medical workup I feel the Young has been appropriately medically screened and is safe for discharge home. Pertinent diagnoses were discussed with the Young. Young was given return precautions.  I personally performed the services described in this documentation, which was scribed in my presence. The recorded information has been reviewed and is accurate.    Colin Baton, MD 01/09/16 [phone]

## 2016-01-09 NOTE — ED Notes (Signed)
Pt transported to CT ?

## 2017-05-15 IMAGING — CT CT ABD-PELV W/ CM
2 of 4 series · 16 of 46 positions shown, 18 images · IV contrast (ISOVUE)
Comparison: Radiographs earlier this day.

CLINICAL DATA: Periumbilical abdominal pain for 2 days.

EXAM:
CT ABDOMEN AND PELVIS WITH CONTRAST
TECHNIQUE: Multidetector CT imaging of the abdomen and pelvis was performed
using the standard protocol following bolus administration of
intravenous contrast.
CONTRAST:  100mL R4P2A5-EXX IOPAMIDOL (R4P2A5-EXX) INJECTION 61%

[Series 2: abd/pel with · axial · 0.70mm/px · z∈[-414,-34]mm · 13 of 85 slices shown, 15 images]
[im 5/85  soft-tissue]
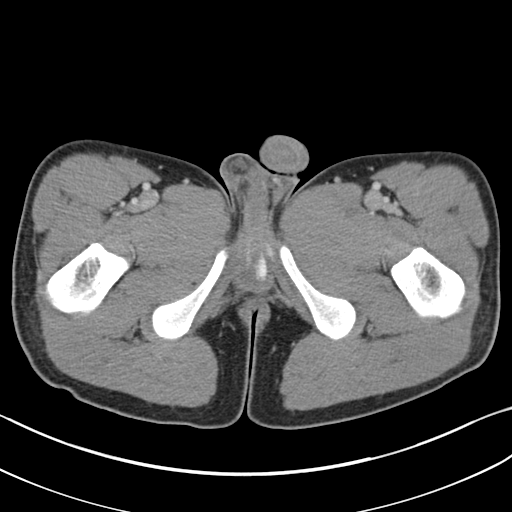
[im 5/85  bone]
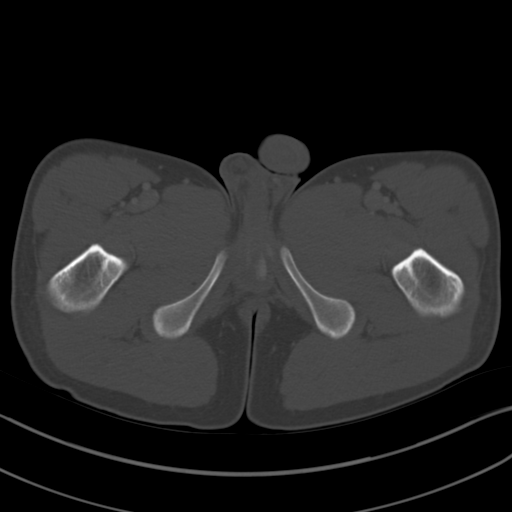
[im 13/85  soft-tissue]
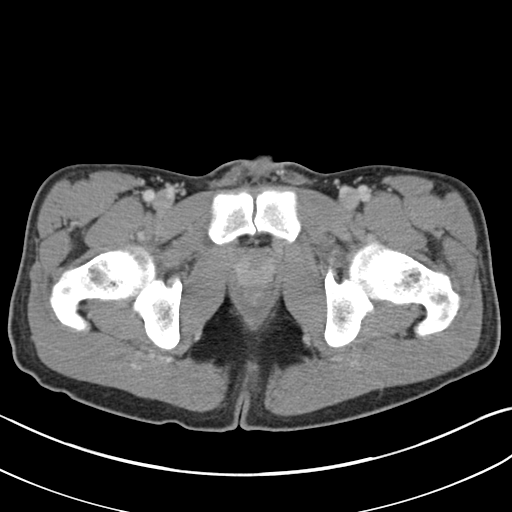
[im 17/85  soft-tissue]
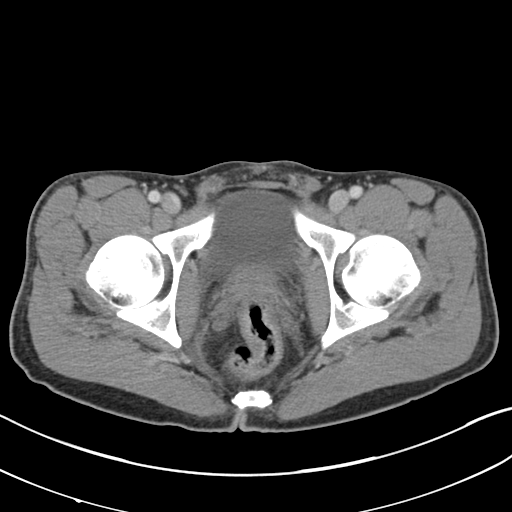
[im 25/85  soft-tissue]
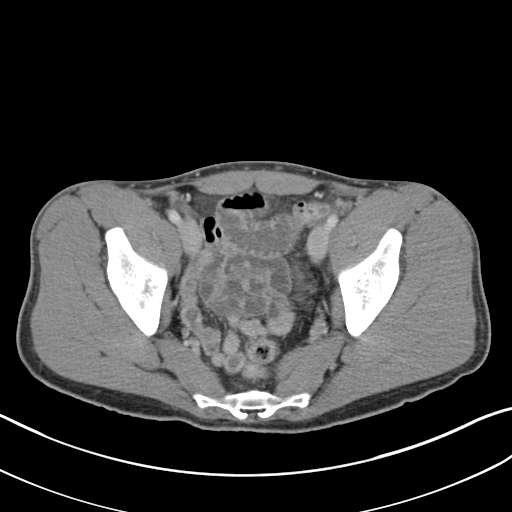
[im 29/85  soft-tissue]
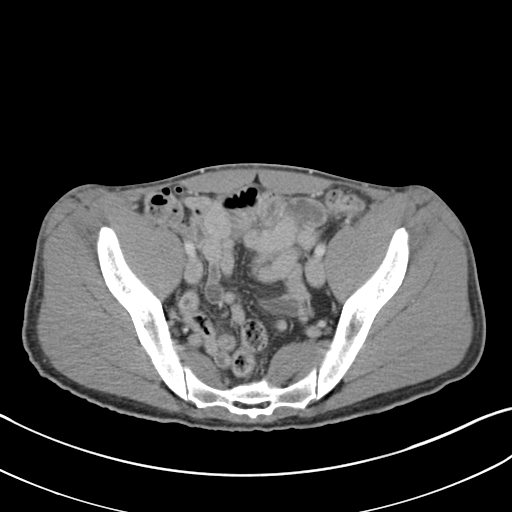
[im 37/85  soft-tissue]
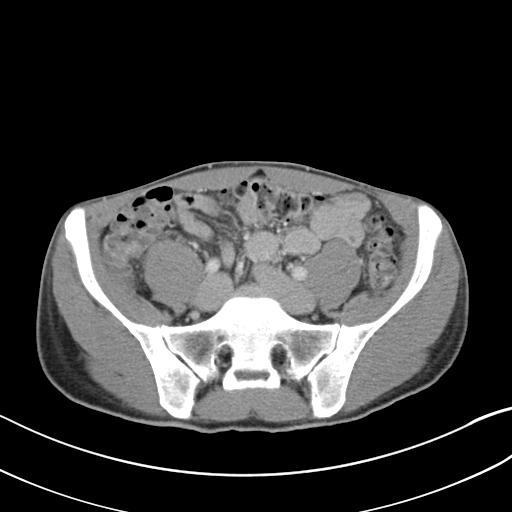
[im 45/85  soft-tissue]
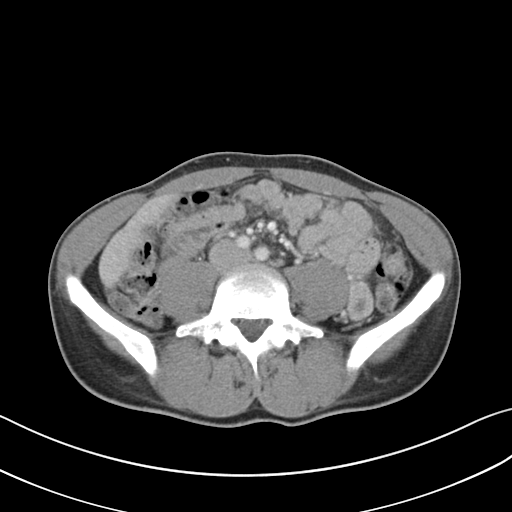
[im 49/85  soft-tissue]
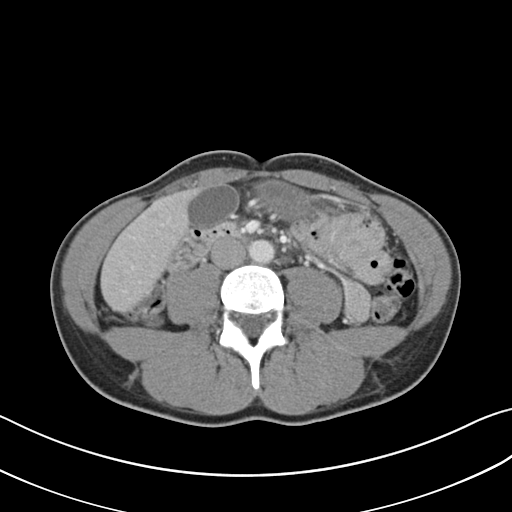
[im 57/85  soft-tissue]
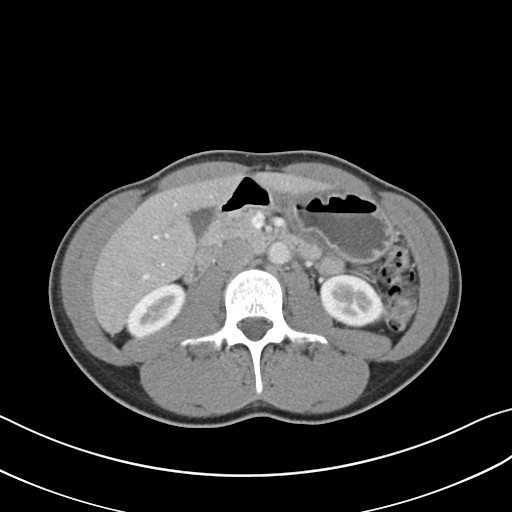
[im 57/85  bone]
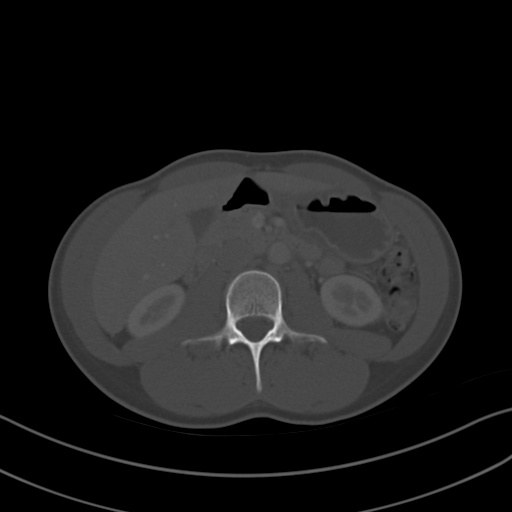
[im 61/85  soft-tissue]
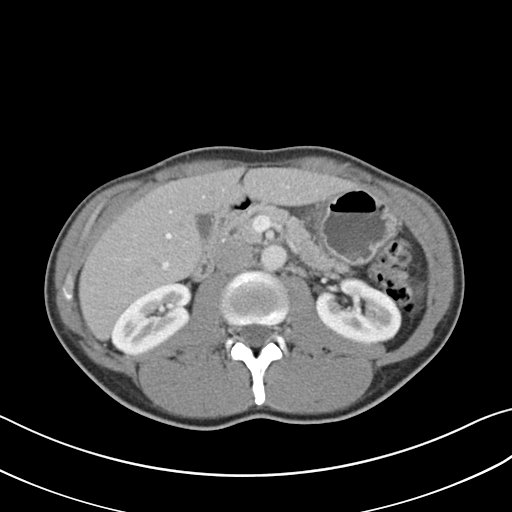
[im 69/85  soft-tissue]
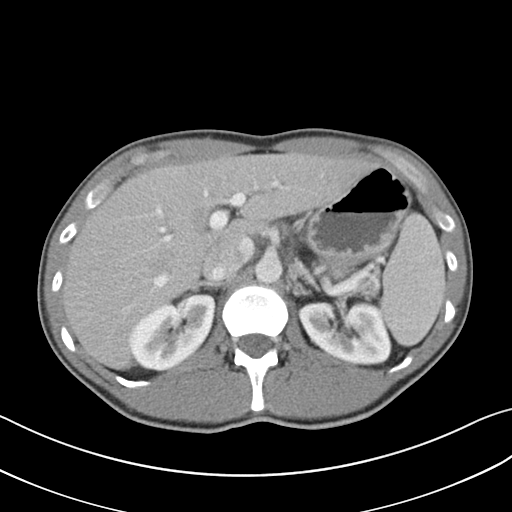
[im 73/85  soft-tissue]
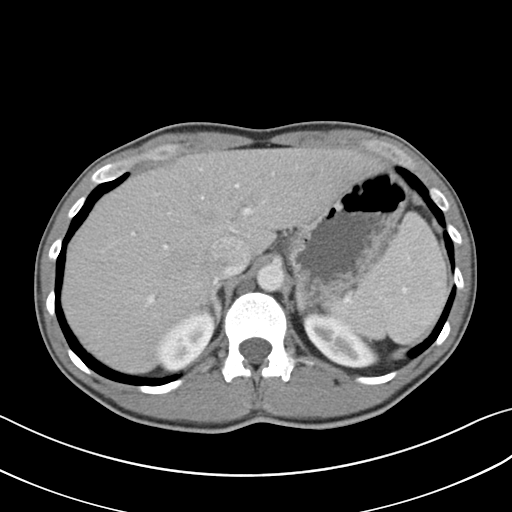
[im 81/85  soft-tissue]
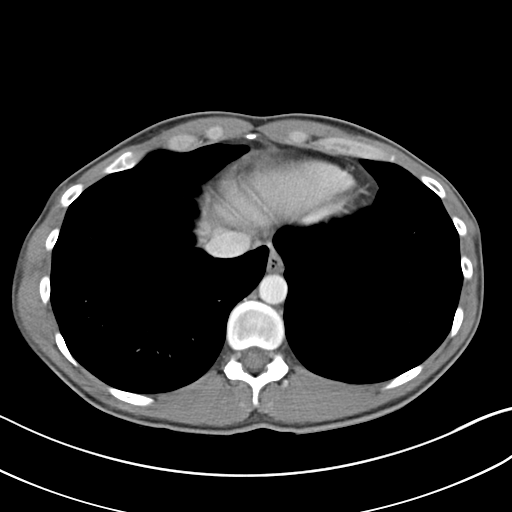

[Series 4: coronal a/|p · coronal · 0.63mm/px · 3 of 102 slices shown]
[im 34/102  soft-tissue]
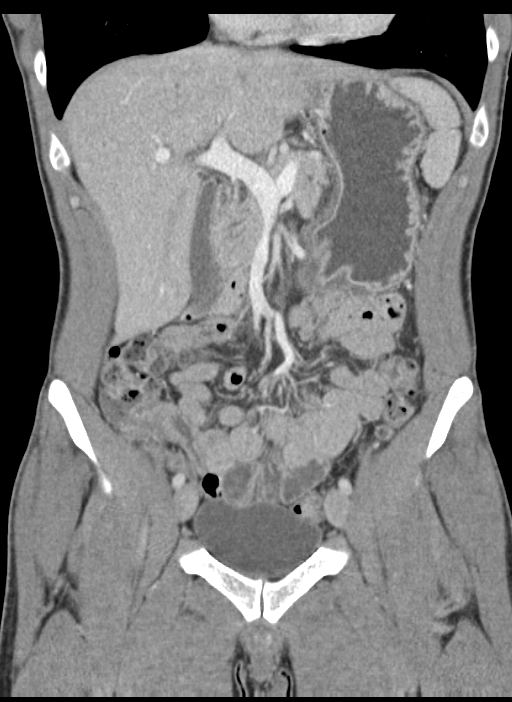
[im 45/102  soft-tissue]
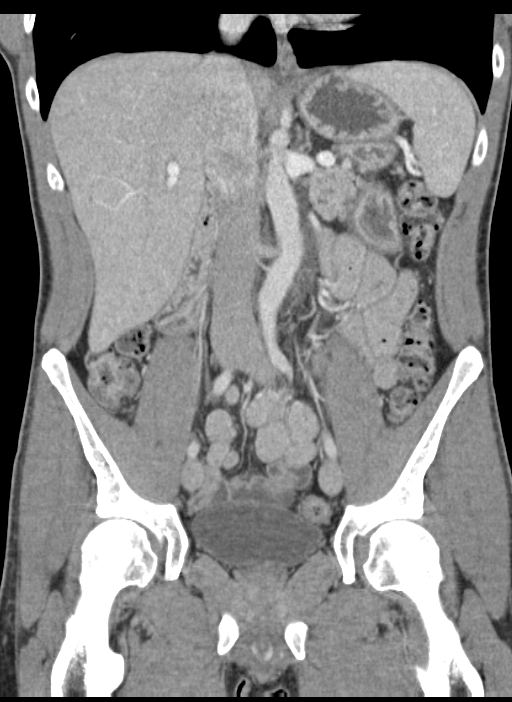
[im 57/102  soft-tissue]
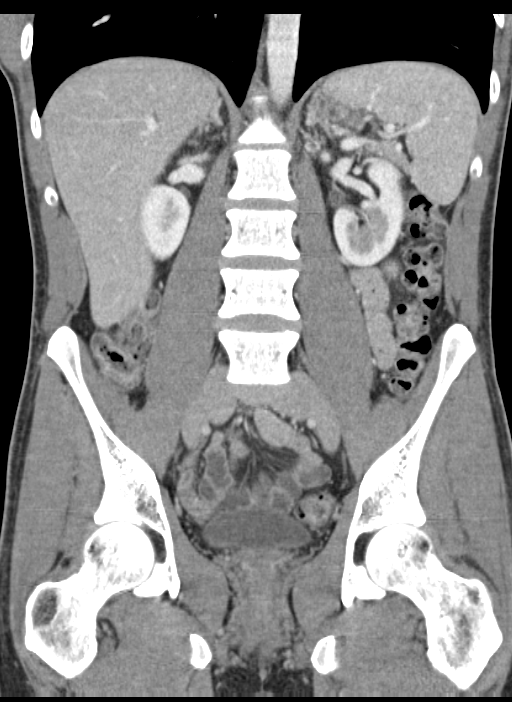

[16 of 46 positions shown; findings below may reference images not displayed]

FINDINGS: Lower chest:  The included lung bases are clear.

Liver: Tiny hypodensities in the right lobe, too small to accurately
characterize but of doubtful clinical significance.

Hepatobiliary: Gallbladder physiologically distended, no calcified
stone. No biliary dilatation.

Pancreas: No ductal dilatation or inflammation.

Spleen: Normal.

Adrenal glands: No nodule.

Kidneys: Symmetric renal enhancement. No hydronephrosis. Tiny
nonobstructing renal stones bilaterally. No perinephric stranding.

Stomach/Bowel: Stomach physiologically distended. Mild pre-pyloric
gastric wall thickening, no adjacent inflammation. There are no
dilated or thickened small bowel loops. Small volume of stool
throughout the colon without colonic wall thickening. The appendix
is normal.

Vascular/Lymphatic: No retroperitoneal adenopathy. Abdominal aorta
is normal in caliber.

Reproductive: Normal sized prostate gland.

Bladder: Physiologically distended, no wall thickening.

Other: No free air, free fluid, or intra-abdominal fluid collection.

Musculoskeletal: There are no acute or suspicious osseous
abnormalities.
IMPRESSION: 1. Mild pre-pyloric gastric wall thickening, can be seen with
gastritis or peptic ulcer disease.
2. Bilateral nonobstructing nephrolithiasis.

## 2019-12-09 ENCOUNTER — Other Ambulatory Visit: Payer: Self-pay | Admitting: Urology

## 2019-12-12 ENCOUNTER — Other Ambulatory Visit (HOSPITAL_COMMUNITY): Payer: 59

## 2019-12-12 ENCOUNTER — Other Ambulatory Visit: Payer: Self-pay

## 2019-12-12 ENCOUNTER — Other Ambulatory Visit (HOSPITAL_COMMUNITY)
Admission: RE | Admit: 2019-12-12 | Discharge: 2019-12-12 | Disposition: A | Discharge status: Home / Self Care | Payer: 59 | Source: Ambulatory Visit | Attending: Urology | Admitting: Urology

## 2019-12-12 ENCOUNTER — Encounter (HOSPITAL_COMMUNITY)
Admission: RE | Admit: 2019-12-12 | Discharge: 2019-12-12 | Disposition: A | Discharge status: Home / Self Care | Payer: 59 | Source: Ambulatory Visit | Attending: Urology | Admitting: Urology

## 2019-12-12 ENCOUNTER — Encounter (HOSPITAL_COMMUNITY): Payer: Self-pay

## 2019-12-12 DIAGNOSIS — Z01812 Encounter for preprocedural laboratory examination: Secondary | ICD-10-CM | POA: Insufficient documentation

## 2019-12-12 DIAGNOSIS — Z20822 Contact with and (suspected) exposure to covid-19: Secondary | ICD-10-CM | POA: Diagnosis not present

## 2019-12-12 HISTORY — DX: Gastro-esophageal reflux disease without esophagitis: K21.9

## 2019-12-12 LAB — CBC
HCT: 42.9 % (ref 39.0–52.0)
Hemoglobin: 14 g/dL (ref 13.0–17.0)
MCH: 29.5 pg (ref 26.0–34.0)
MCHC: 32.6 g/dL (ref 30.0–36.0)
MCV: 90.3 fL (ref 80.0–100.0)
Platelets: 212 10*3/uL (ref 150–400)
RBC: 4.75 MIL/uL (ref 4.22–5.81)
RDW: 12.1 % (ref 11.5–15.5)
WBC: 6.5 10*3/uL (ref 4.0–10.5)
nRBC: 0 % (ref 0.0–0.2)

## 2019-12-12 LAB — SARS CORONAVIRUS 2 (TAT 6-24 HRS): SARS Coronavirus 2: NEGATIVE

## 2019-12-12 NOTE — Progress Notes (Signed)
PCP - Dr. Venita Lick Cardiologist - no  Chest x-ray - no EKG - no Stress Test - no ECHO - no Cardiac Cath - no  Sleep Study - no CPAP -   Fasting Blood Sugar - NA Checks Blood Sugar _____ times a day  Blood Thinner Instructions:NA Aspirin Instructions: Last Dose:  Anesthesia review:   Patient denies shortness of breath, fever, cough and chest pain at PAT appointment yes  Patient verbalized understanding of instructions that were given to them at the PAT appointment. Patient was also instructed that they will need to review over the PAT instructions again at home before surgery. yes

## 2019-12-12 NOTE — Patient Instructions (Addendum)
DUE TO COVID-19 ONLY ONE VISITOR IS ALLOWED TO COME WITH YOU AND STAY IN THE WAITING ROOM ONLY DURING PRE OP AND PROCEDURE DAY OF SURGERY. THE 1 VISITOR MAY VISIT WITH YOU AFTER SURGERY IN YOUR PRIVATE ROOM DURING VISITING HOURS ONLY!  YOU NEED TO HAVE A COVID 19 TEST ON_4/26______ @_12 :30______, THIS TEST MUST BE DONE BEFORE SURGERY, COME  [address], [place] , .  [facility]) ONCE YOUR COVID TEST IS COMPLETED, PLEASE BEGIN THE QUARANTINE INSTRUCTIONS AS OUTLINED IN YOUR HANDOUT.                Colin Young    Your procedure is scheduled on: 12/15/19   Report to [facility] Main  Entrance   Report to admitting at   9:55 AM     Call this number if you have problems the morning of surgery [phone]    Remember: Do not eat food or drink liquids :After Midnight.   BRUSH YOUR TEETH MORNING OF SURGERY AND RINSE YOUR MOUTH OUT, NO CHEWING GUM CANDY OR MINTS.     Take these medicines the morning of surgery with A SIP OF WATER: Prilosec                                 You may not have any metal on your body including              piercings  Do not wear jewelry,  lotions, powders or deodorant                     Men may shave face and neck.   Do not bring valuables to the hospital. [facility] IS NOT             RESPONSIBLE   FOR VALUABLES.  Contacts, dentures or bridgework may not be worn into surgery.       Patients discharged the day of surgery will not be allowed to drive home.   IF YOU ARE HAVING SURGERY AND GOING HOME THE SAME DAY, YOU MUST HAVE AN ADULT TO DRIVE YOU HOME AND BE WITH YOU FOR 24 HOURS.   YOU MAY GO HOME BY TAXI OR UBER OR ORTHERWISE, BUT AN ADULT MUST ACCOMPANY YOU HOME AND STAY WITH YOU FOR 24 HOURS.  Name and phone number of your driver:  Special Instructions: N/A              Please read over the following fact sheets you were  given: _____________________________________________________________________             [facility] - Preparing for Surgery  Before surgery, you can play an important role.   Because skin is not sterile, your skin needs to be as free of germs as possible .  You can reduce the number of germs on your skin by washing with CHG (chlorahexidine gluconate) soap before surgery.   CHG is an antiseptic cleaner which kills germs and bonds with the skin to continue killing germs even after washing. Please DO NOT use if you have an allergy to CHG or antibacterial soaps.   If your skin becomes reddened/irritated stop using the CHG and inform your nurse when you arrive at Short Stay.   You may shave your face/neck.  Please follow these instructions carefully:  1.  Shower with CHG Soap the night before surgery and the  morning of  Surgery.  2.  If you choose to wash your hair, wash your hair first as usual with your  normal  shampoo.  3.  After you shampoo, rinse your hair and body thoroughly to remove the  shampoo.                                        4.  Use CHG as you would any other liquid soap.  You can apply chg directly  to the skin and wash                       Gently with a scrungie or clean washcloth.  5.  Apply the CHG Soap to your body ONLY FROM THE NECK DOWN.   Do not use on face/ open                           Wound or open sores. Avoid contact with eyes, ears mouth and genitals (private parts).                       Wash face,  Genitals (private parts) with your normal soap.             6.  Wash thoroughly, paying special attention to the area where your surgery  will be performed.  7.  Thoroughly rinse your body with warm water from the neck down.  8.  DO NOT shower/wash with your normal soap after using and rinsing off  the CHG Soap.             9.  Pat yourself dry with a clean towel.            10.  Wear clean pajamas.            11.  Place clean sheets on your bed the night of  your first shower and do not  sleep with pets. Day of Surgery : Do not apply any lotions/deodorants the morning of surgery.  Please wear clean clothes to the hospital/surgery center.  FAILURE TO FOLLOW THESE INSTRUCTIONS MAY RESULT IN THE CANCELLATION OF YOUR SURGERY PATIENT SIGNATURE_________________________________  NURSE SIGNATURE__________________________________  ________________________________________________________________________

## 2019-12-15 ENCOUNTER — Ambulatory Visit (HOSPITAL_COMMUNITY): Admission: RE | Admit: 2019-12-15 | Payer: 59 | Source: Home / Self Care | Admitting: Urology

## 2019-12-15 ENCOUNTER — Encounter (HOSPITAL_COMMUNITY): Admission: RE | Payer: Self-pay | Source: Home / Self Care

## 2019-12-15 SURGERY — CYSTOURETEROSCOPY, WITH RETROGRADE PYELOGRAM AND STENT INSERTION
Anesthesia: General | Laterality: Left

## 2020-07-10 ENCOUNTER — Telehealth: Payer: 59 | Admitting: Emergency Medicine

## 2020-07-10 DIAGNOSIS — S39012A Strain of muscle, fascia and tendon of lower back, initial encounter: Secondary | ICD-10-CM | POA: Diagnosis not present

## 2020-07-10 DIAGNOSIS — M549 Dorsalgia, unspecified: Secondary | ICD-10-CM

## 2020-07-10 MED ORDER — NAPROXEN 500 MG PO TABS: 500.0000 mg | ORAL_TABLET | Freq: Three times a day (TID) | ORAL | 0 refills | Status: AC

## 2020-07-10 MED ORDER — CYCLOBENZAPRINE HCL 10 MG PO TABS
10.0000 mg | ORAL_TABLET | Freq: Three times a day (TID) | ORAL | 0 refills | Status: AC | PRN
Start: 2020-07-10 — End: ?

## 2020-07-10 NOTE — Progress Notes (Signed)
Time spent: 10 min  We are sorry that you are not feeling well.  Here is how we plan to help!  Based on what you have shared with me it looks like you mostly have acute back pain.  Typically this is from a over worked muscle, a muscle strain or spasm.   Acute back pain is defined as musculoskeletal pain that can resolve in 1-3 weeks with conservative treatment.  For mild to moderate pain, take [phone] mg acetaminophen (tylenol) every 6-8 hours.  For more severe pain you can add more anti-inflammatory medicines.  I have prescribed Naprosyn 500 mg take one by mouth twice or three times a day non-steroid anti-inflammatory (NSAID) as well as Flexeril 10 mg every eight hours as needed which is a muscle relaxer  Some patients experience stomach irritation or in increased heartburn with anti-inflammatory drugs.  Please keep in mind that muscle relaxer's can cause fatigue and should not be taken while at work or driving.  Back pain is very common.  The pain often gets better over time.  The cause of back pain is usually not dangerous.  Most people can learn to manage their back pain on their own.   Other over the counter medicines that can help are lidocaine 4% patches (Salonpas or generic brand), diclofenac gel (Voltaren), heating pad.   Home Care  Stay active.  Start with short walks on flat ground if you can.  Try to walk farther each day.  Do not sit, drive or stand in one place for more than 30 minutes.  Do not stay in bed.  Do not avoid exercise or work.  Activity can help your back heal faster.  Be careful when you bend or lift an object.  Bend at your knees, keep the object close to you, and do not twist.  Sleep on a firm mattress.  Lie on your side, and bend your knees.  If you lie on your back, put a pillow under your knees.  Only take medicines as told by your doctor.  Put ice on the injured area.  Put ice in a plastic bag  Place a towel between your skin and the bag  Leave the  ice on for 15-20 minutes, 3-4 times a day for the first 2-3 days. 210 After that, you can switch between ice and heat packs.  Ask your doctor about back exercises or massage.  Avoid feeling anxious or stressed.  Find good ways to deal with stress, such as exercise.  Get Help Right Way If:  Your pain does not go away with rest or medicine.  Your pain does not go away in 1 week.  You have new problems.  You do not feel well.  The pain spreads into your legs.  You cannot control when you poop (bowel movement) or pee (urinate)  You feel sick to your stomach (nauseous) or throw up (vomit)  You have belly (abdominal) pain.  You feel like you may pass out (faint).  If you develop a fever.  Make Sure you:  Understand these instructions.  Will watch your condition  Will get help right away if you are not doing well or get worse.  Your e-visit answers were reviewed by a board certified advanced clinical practitioner to complete your personal care plan.  Depending on the condition, your plan could have included both over the counter or prescription medications.  If there is a problem please reply  once you have received a response from  your provider.  Your safety is important to [place].  If you have drug allergies check your prescription carefully.    You can use MyChart to ask questions about today's visit, request a non-urgent call back, or ask for a work or school excuse for 24 hours related to this e-Visit. If it has been greater than 24 hours you will need to follow up with your provider, or enter a new e-Visit to address those concerns.  You will get an e-mail in the next two days asking about your experience.  I hope that your e-visit has been valuable and will speed your recovery. Thank you for using e-visits.

## 2020-07-10 NOTE — Progress Notes (Signed)
Error - patient submitted 2 visits for same complaint
